# Patient Record
Sex: Female | Born: 1940 | Race: Black or African American | Hispanic: No
Health system: Southern US, Community
[De-identification: ages and names within clinical notes are randomized; demographics above are authoritative.]

---

## 2004-09-25 ENCOUNTER — Inpatient Hospital Stay: Payer: Self-pay | Admitting: Anesthesiology

## 2006-03-31 IMAGING — US US EXTREM LOW VENOUS BILAT
1 series · 17 of 24 positions shown · non-contrast
Comparison: none

REASON FOR EXAM: Edema,  ? DVT
COMMENTS:

[Series 1: us extrem low venous bilat · 17 of 31 slices shown]
[im 1/31]
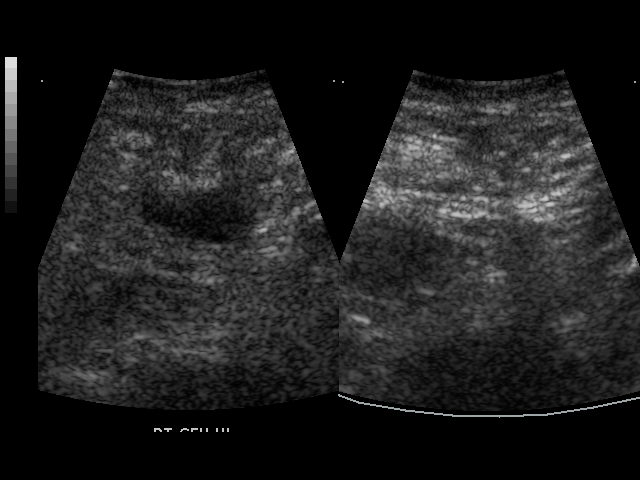
[im 3/31]
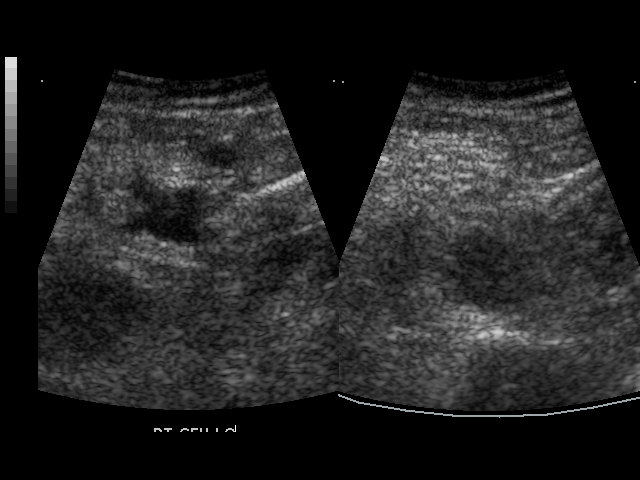
[im 4/31]
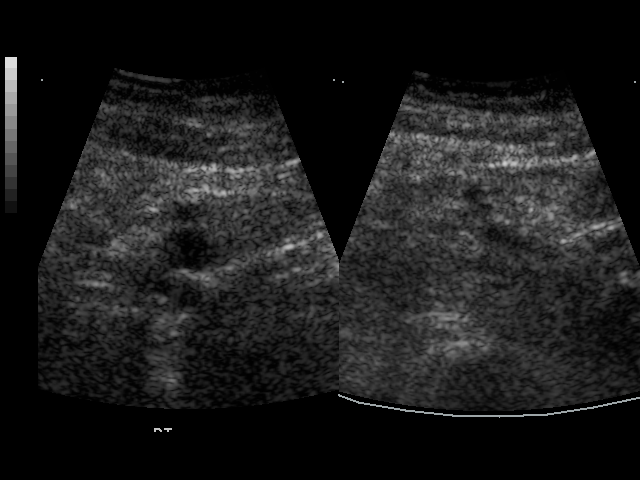
[im 6/31]
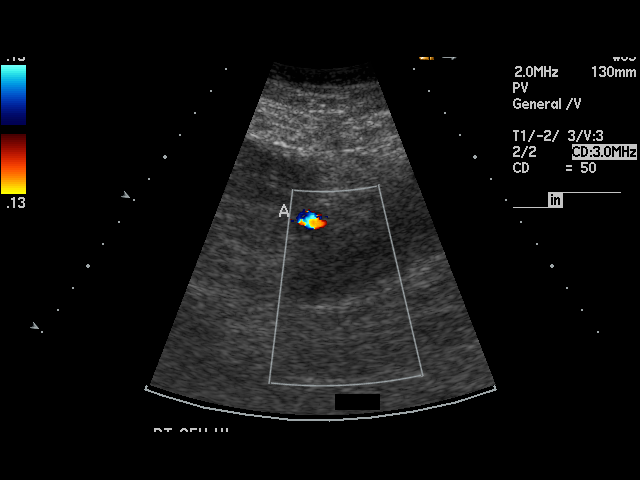
[im 8/31]
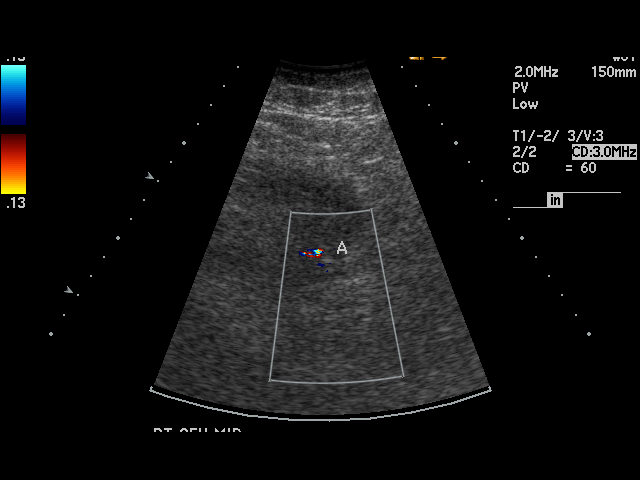
[im 10/31]
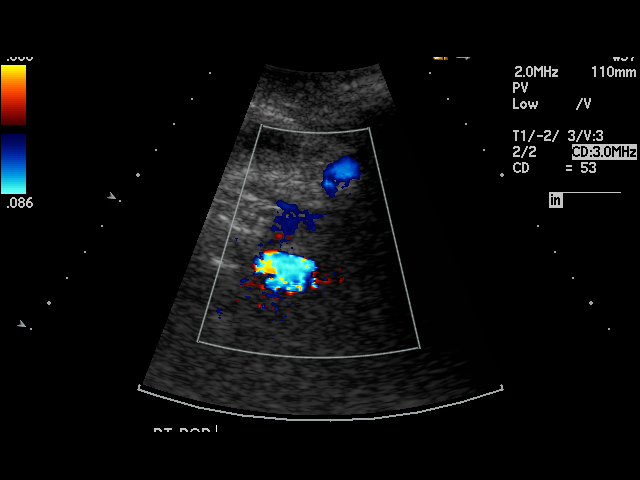
[im 12/31]
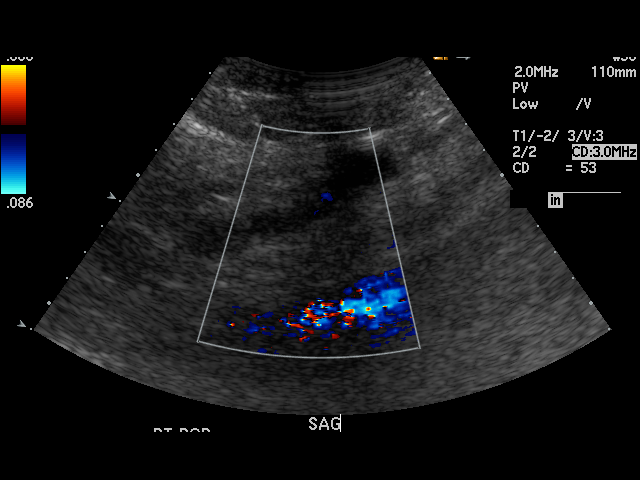
[im 14/31]
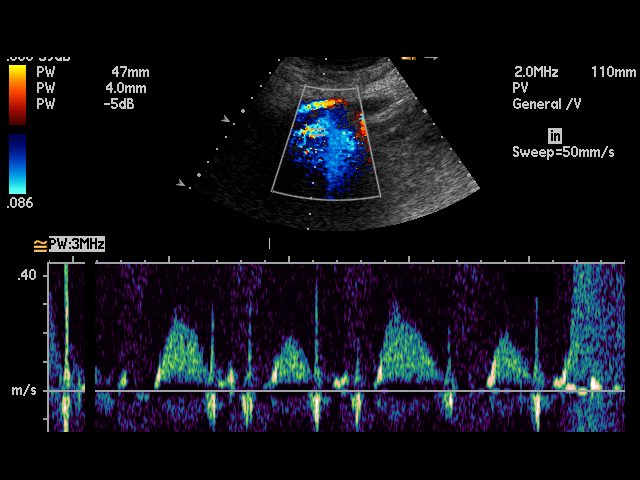
[im 16/31]
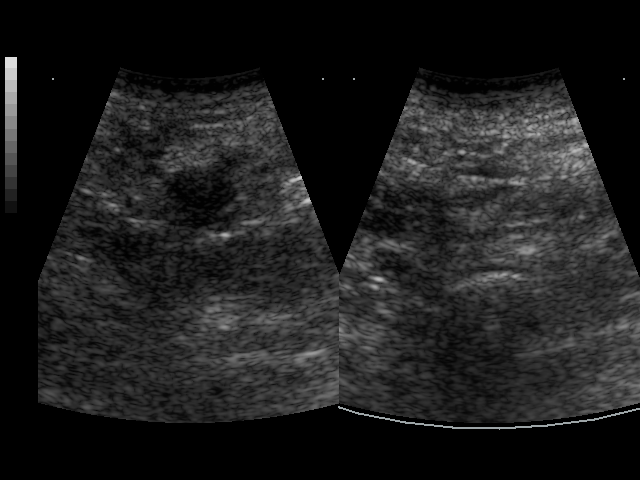
[im 17/31]
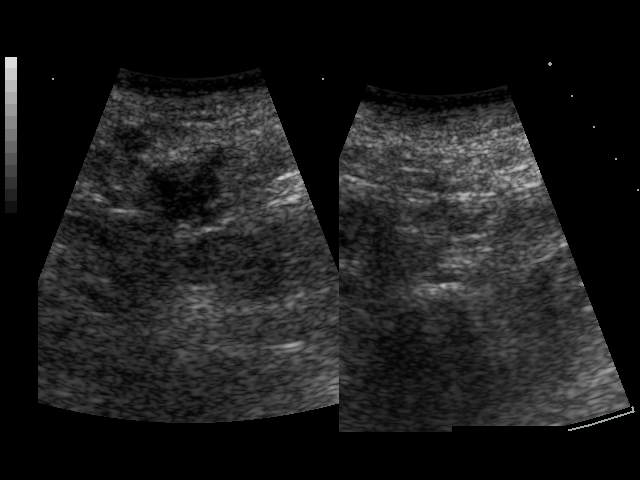
[im 19/31]
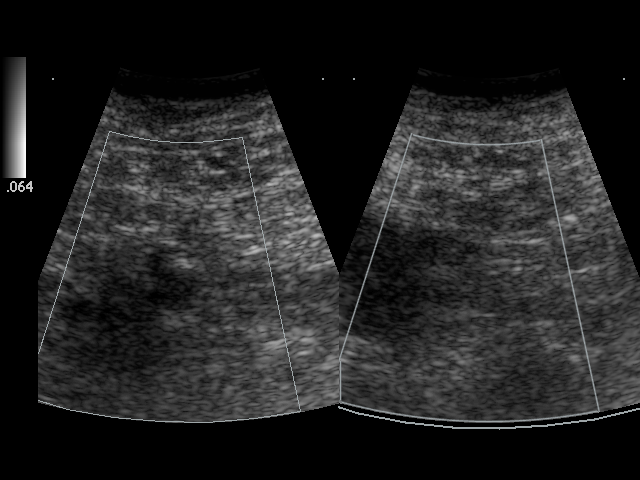
[im 21/31]
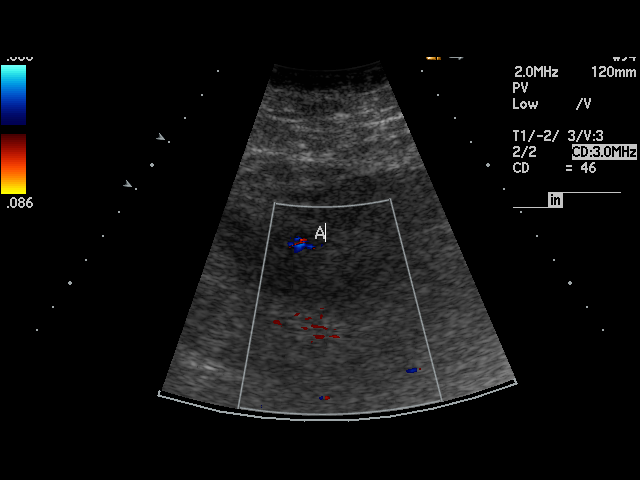
[im 23/31]
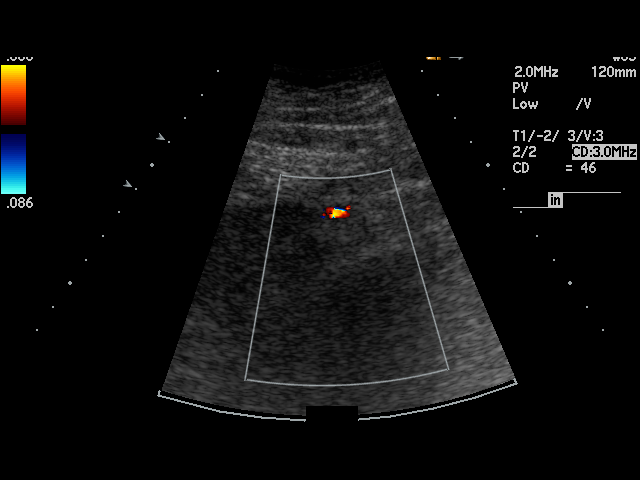
[im 25/31]
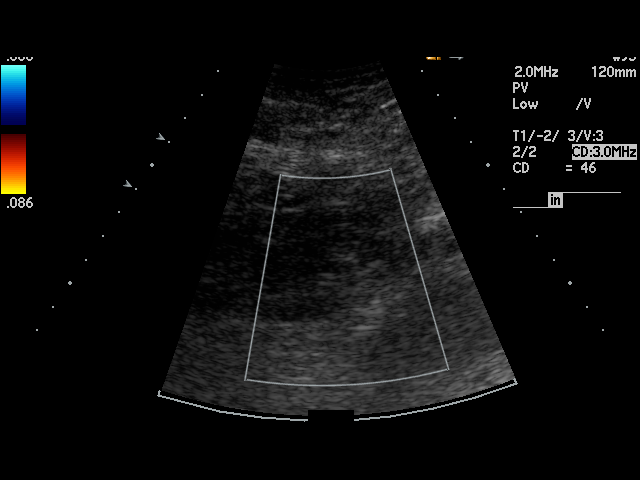
[im 27/31]
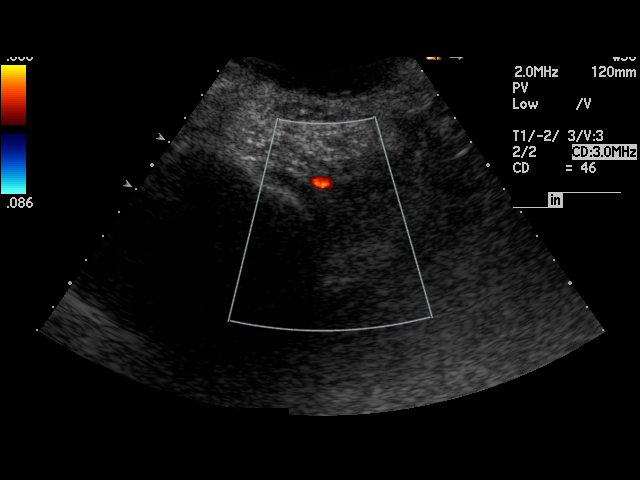
[im 28/31]
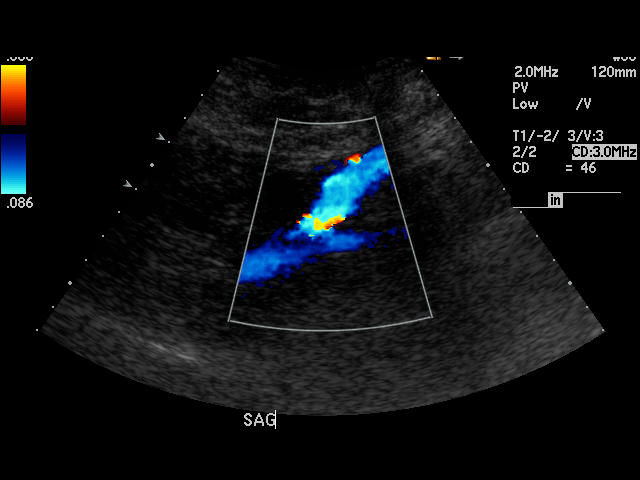
[im 31/31]
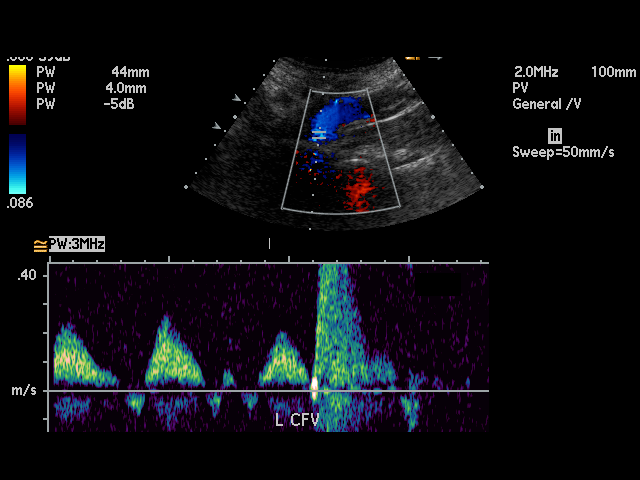

[17 of 24 positions shown; findings below may reference images not displayed]

PROCEDURE:     US  - US DOPPLER LOW EXTR BILATERAL  - September 27, 2004  [DATE]

RESULT:     This examination is limited particularly on the RIGHT since the
deep venous system could not be followed throughout its course by
ultrasound.  The proximal superficial femoral is seen bilaterally and is
patent.  Portions of the superficial femoral and popliteal are seen
bilaterally and do show flow and normal compressibility but all segments are
not visualized on this exam particularly on the RIGHT.  No findings in the
area visualized are seen to suggest deep venous thrombosis, but nonocclusive
thrombosis could be present and not detected.
IMPRESSION: This examination is limited, particularly on the RIGHT.

No deep venous thrombosis is identified but the venous structures are not
sufficiently visualized to definitely exclude nonocclusive thrombus.

## 2006-04-01 IMAGING — CR DG CHEST 2V
1 series · 2 of 2 positions shown · non-contrast
Comparison: none

REASON FOR EXAM: COPD
COMMENTS:

[Series 1: view not recorded · 0.17mm/px · 2 of 2 slices shown]
[im 1/2]
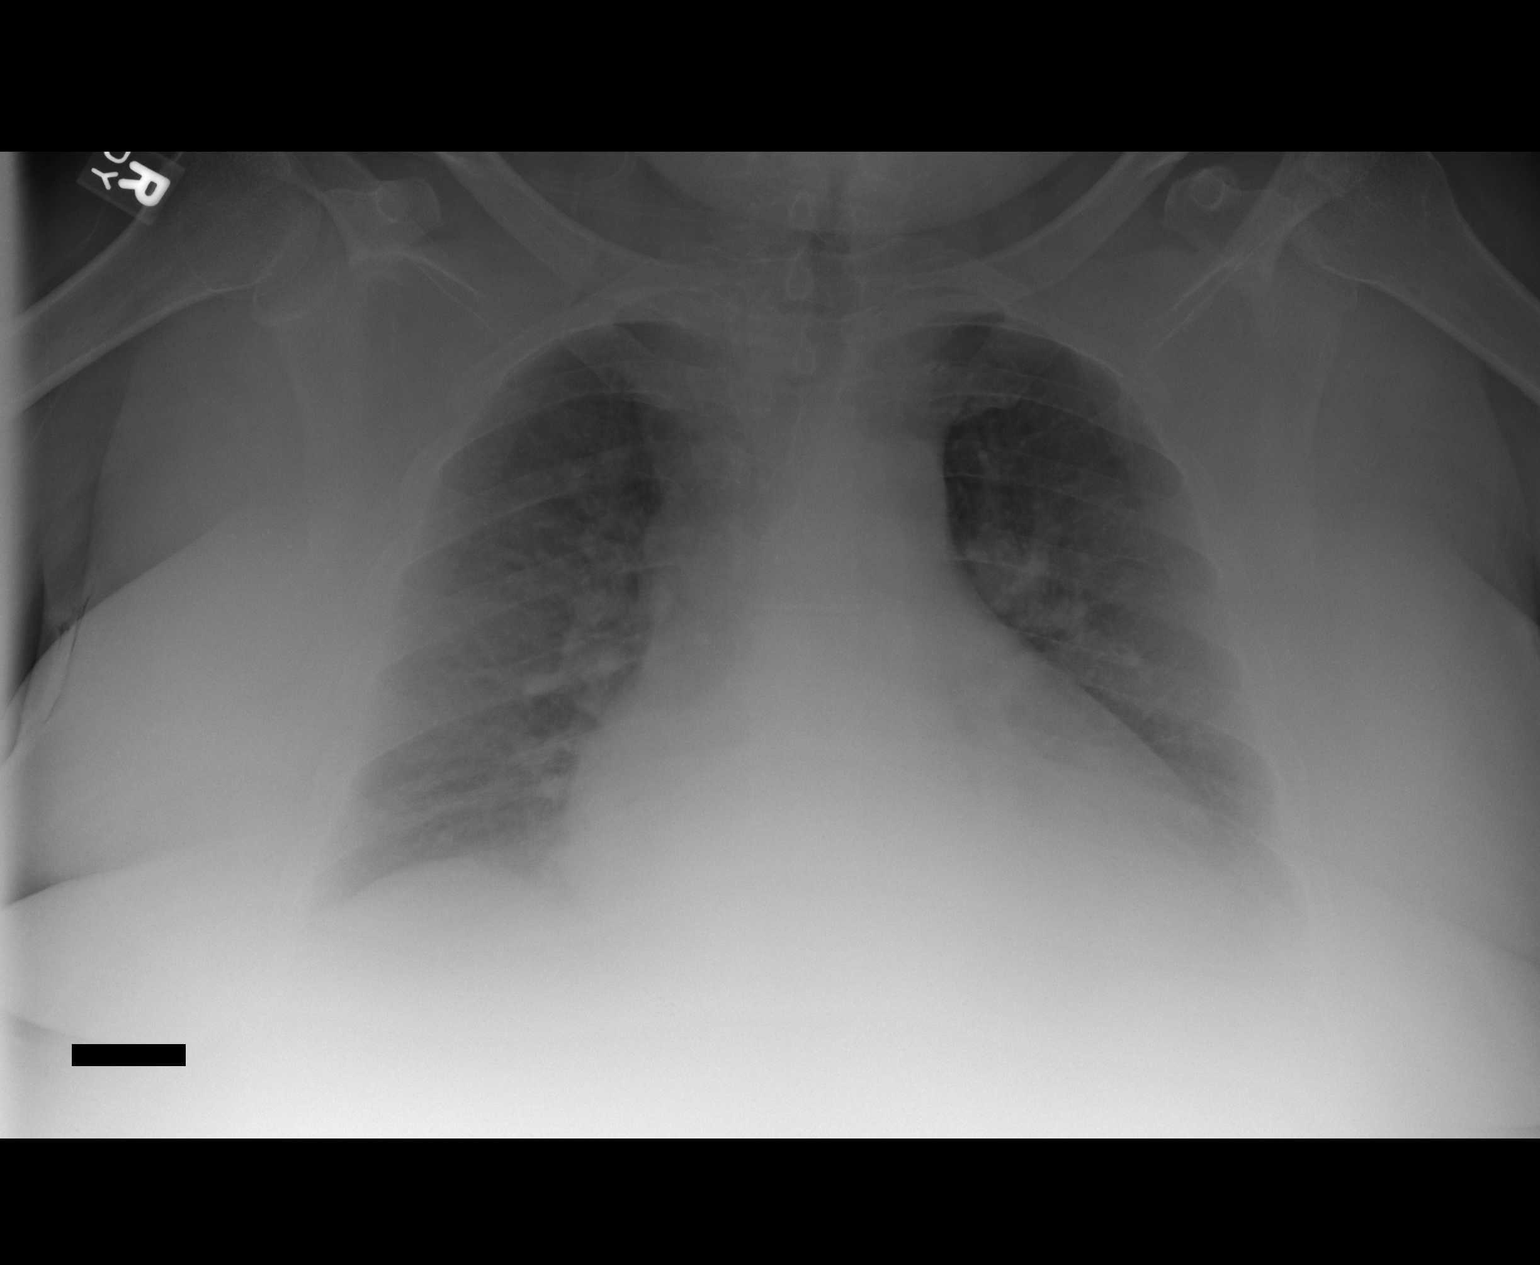
[im 2/2]
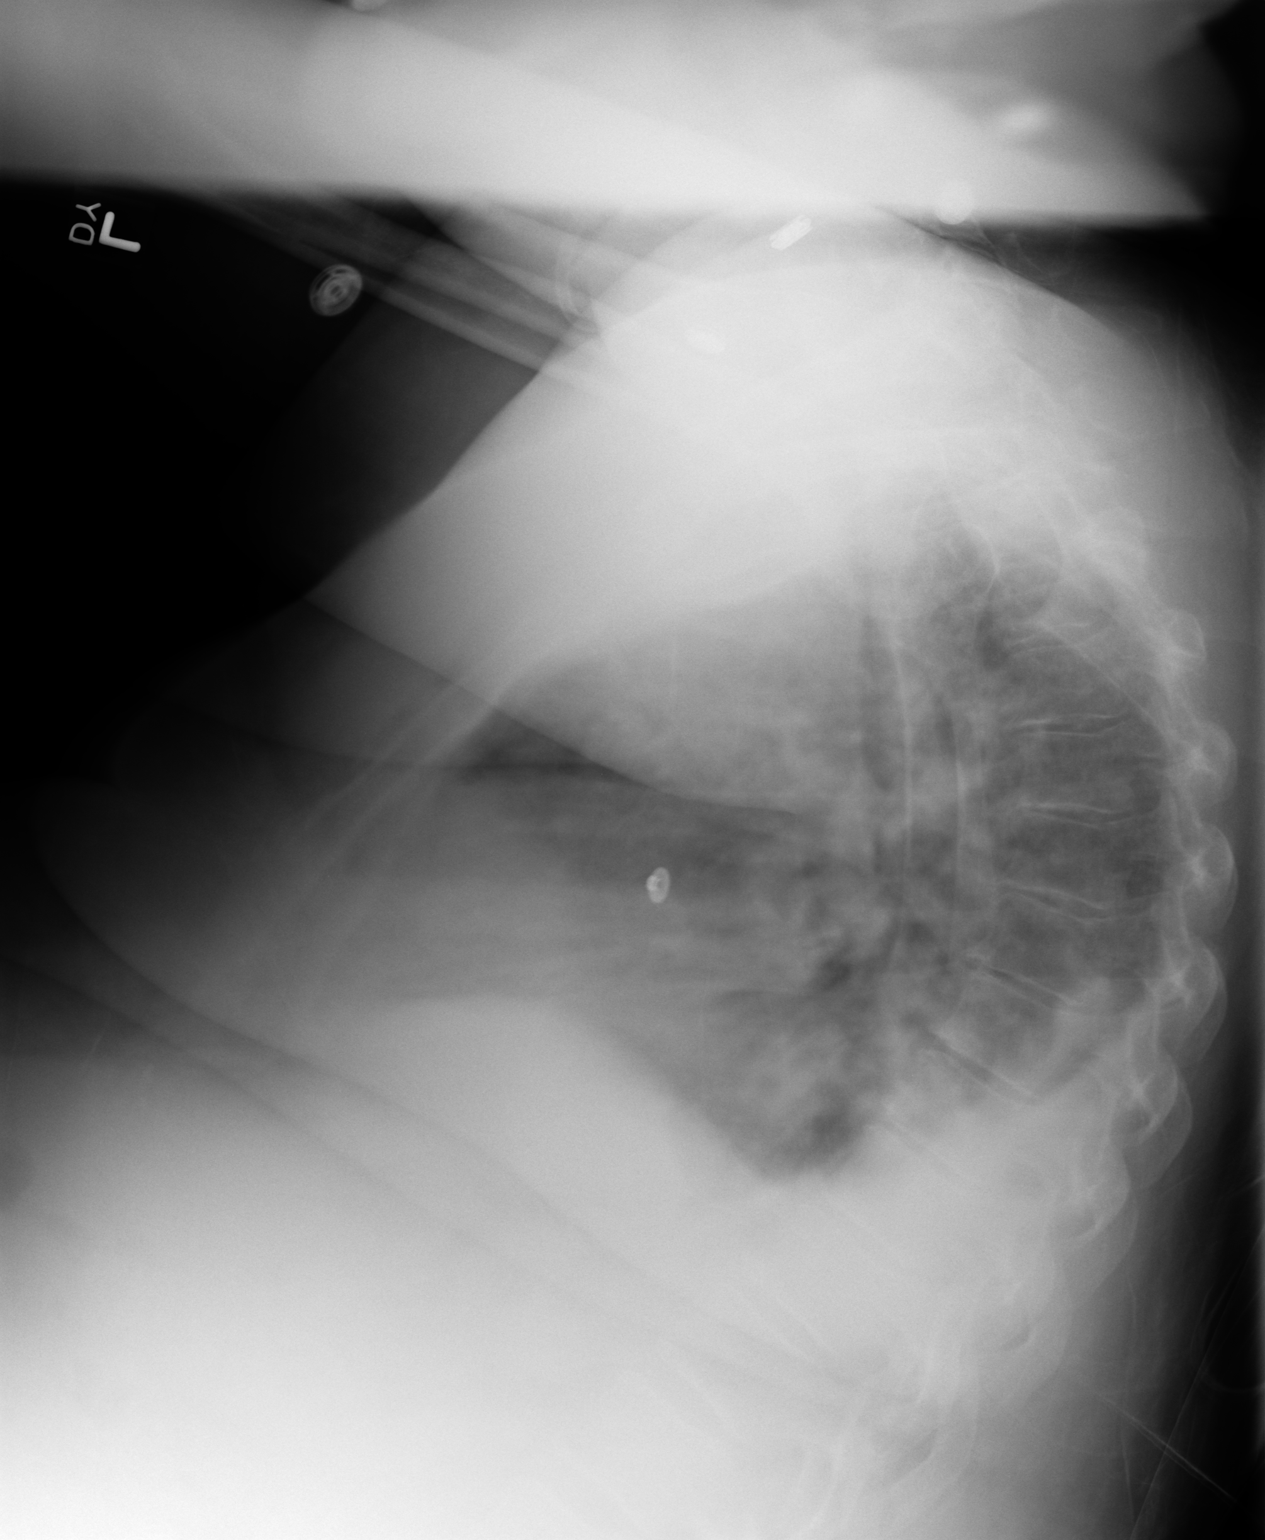

[2 of 2 positions shown; findings below may reference images not displayed]

PROCEDURE:     DXR - DXR CHEST PA (OR AP) AND LATERAL  - September 28, 2004  [DATE]

RESULT:     Comparison is made to a study of 09-27-04.

The lungs are mildly hypoinflated.  There remain fluid effusions layering
posteriorly.  The cardiopericardial silhouette remains enlarged.  The
pulmonary vascularity is somewhat less distinct today.
IMPRESSION: There are findings consistent with congestive heart failure and pulmonary
interstitial and alveolar edema as well as bilateral pleural effusion.
There does appear to have been some deterioration in the appearance of the
pulmonary interstitium since the prior study.

## 2006-04-03 IMAGING — US US RENAL KIDNEY
1 series · 17 of 18 positions shown · non-contrast
Comparison: none

REASON FOR EXAM: renal insufficiency
COMMENTS:

[Series 1: us renal kidney · 17 of 18 slices shown]
[im 1/18]
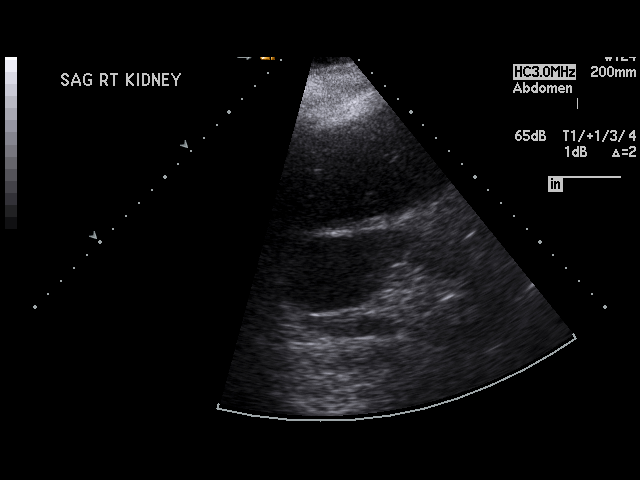
[im 2/18]
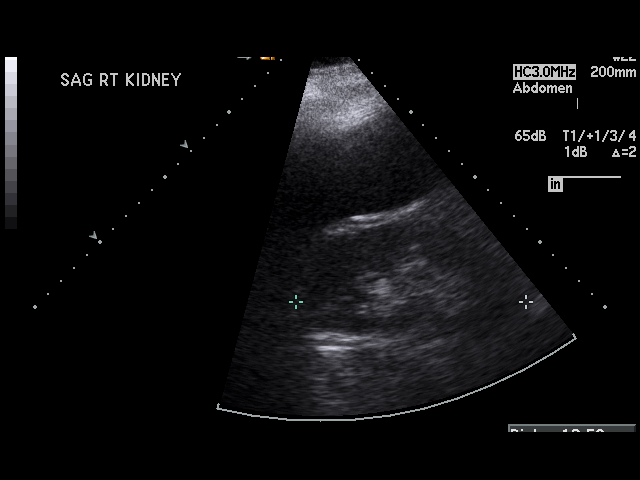
[im 3/18]
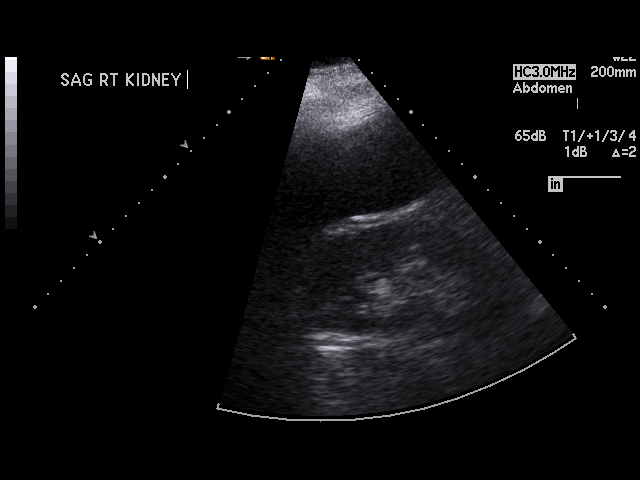
[im 4/18]
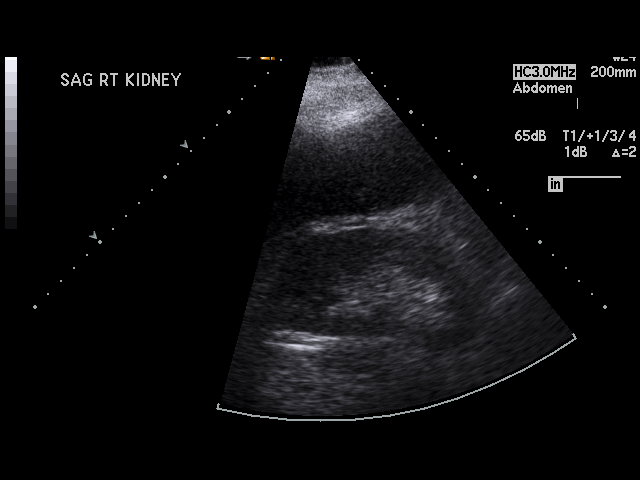
[im 5/18]
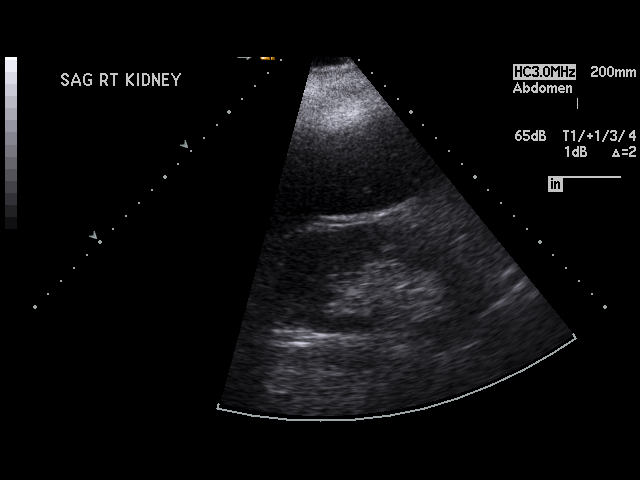
[im 6/18]
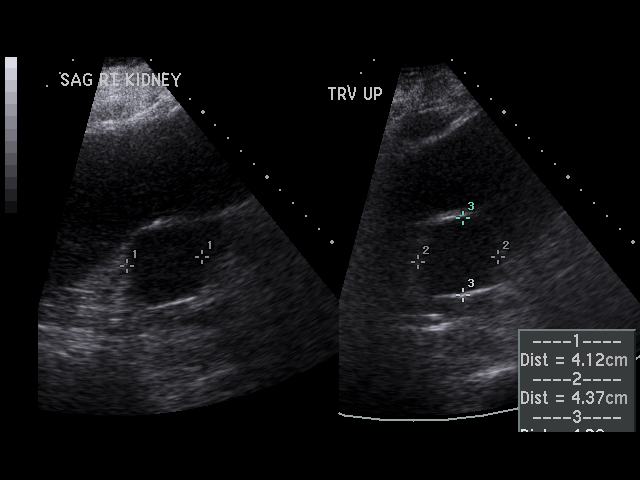
[im 7/18]
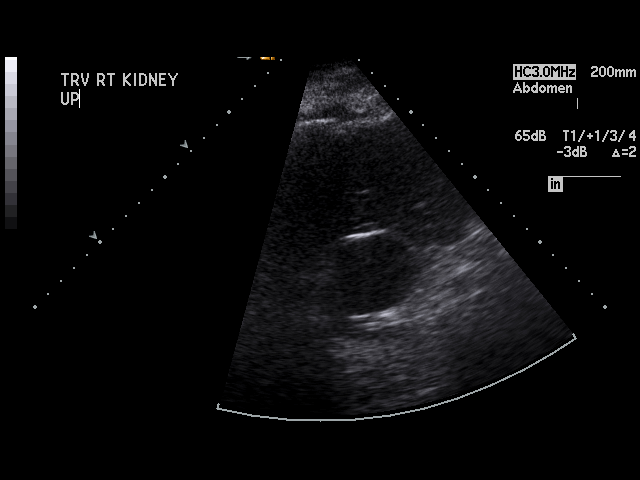
[im 8/18]
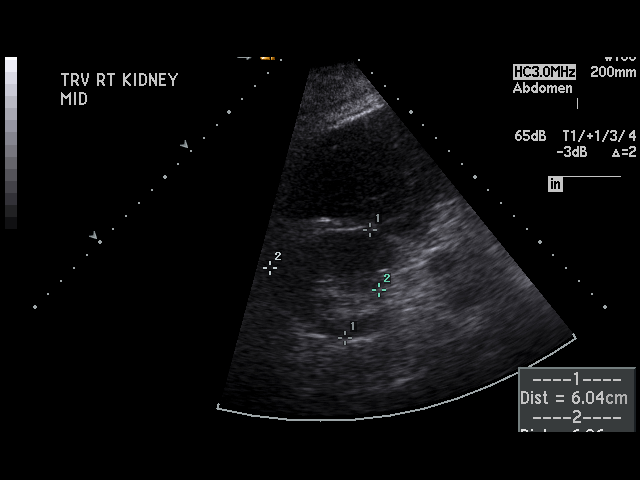
[im 10/18]
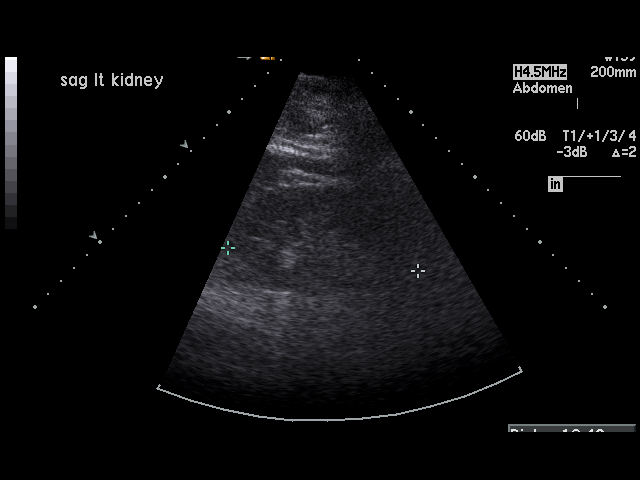
[im 11/18]
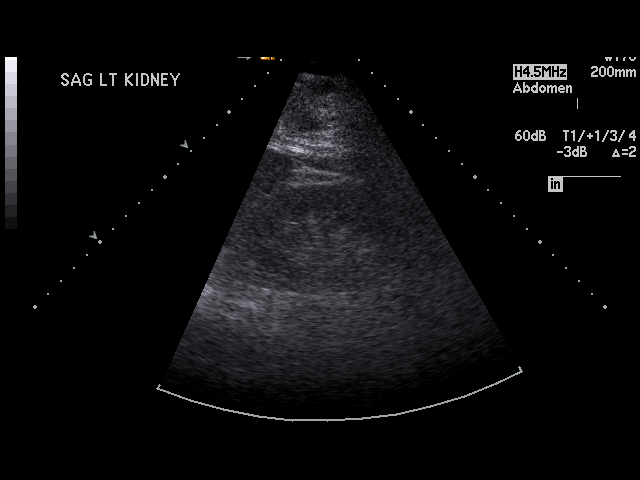
[im 12/18]
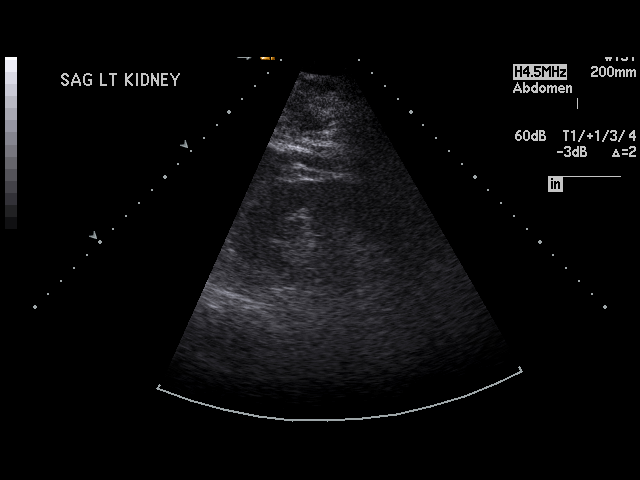
[im 13/18]
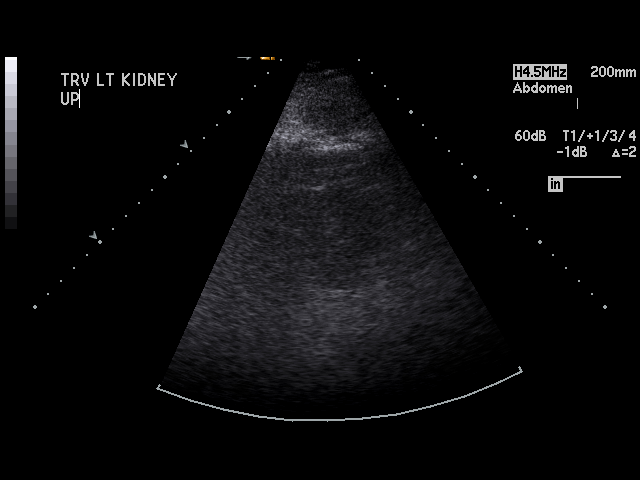
[im 14/18]
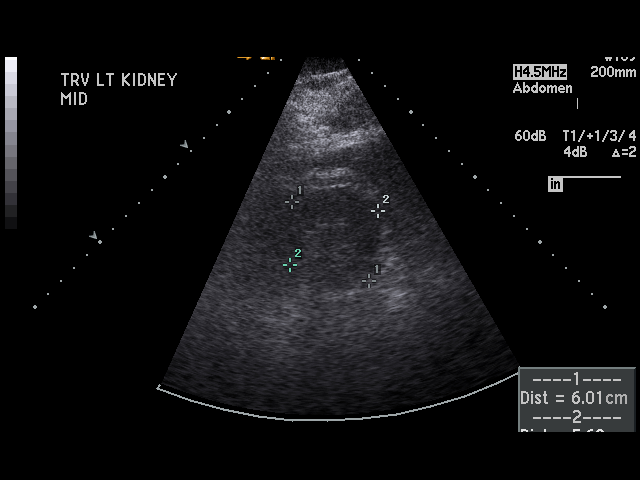
[im 15/18]
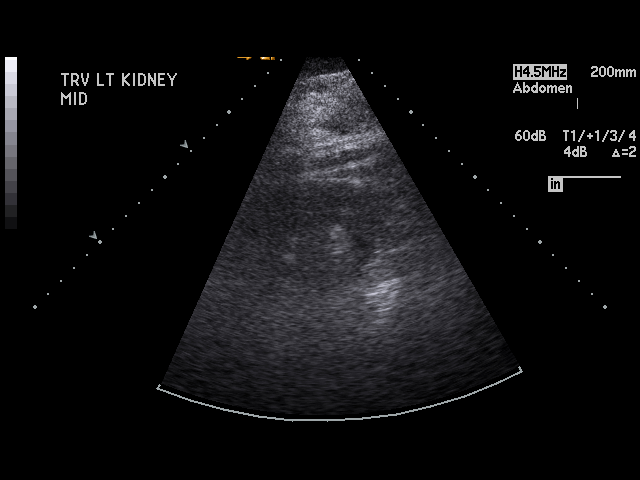
[im 16/18]
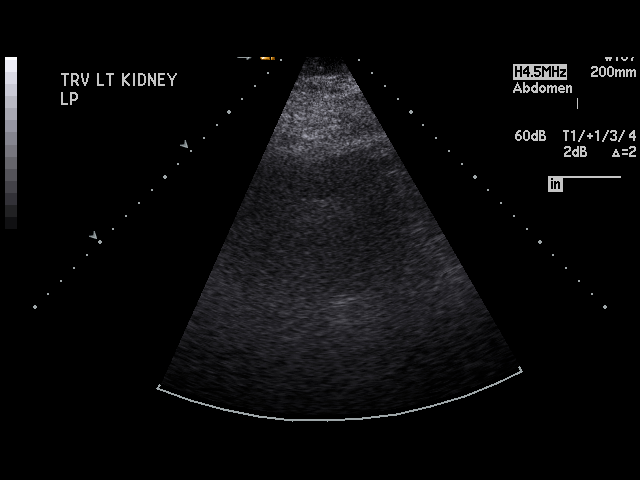
[im 17/18]
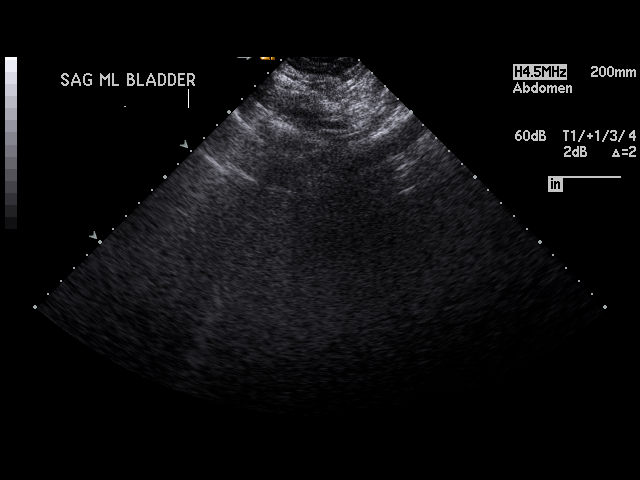
[im 18/18]
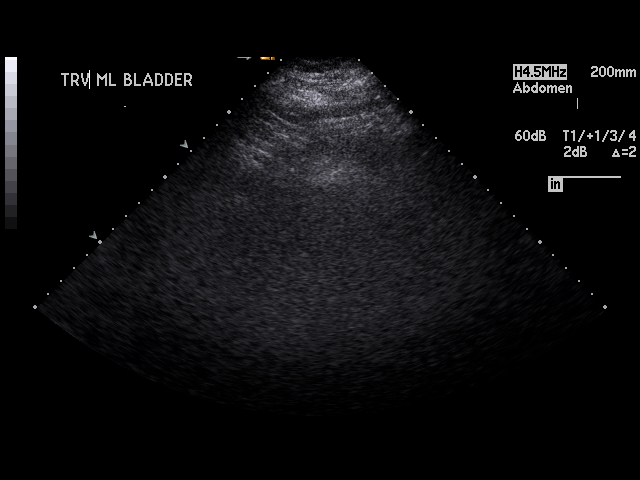

[17 of 18 positions shown; findings below may reference images not displayed]

PROCEDURE:     US  - US KIDNEY BILATERAL  - September 30, 2004  [DATE]

RESULT:        Duplex Doppler evaluation of the kidneys is performed.  The
RIGHT kidney measures 12.5 x 6.0 x 6.0 cm and contains a 4.3 x 4.2 x 4.1 cm
upper pole cyst.  The LEFT kidney measures 10.4 x 6.0 x 5.0 cm.  No fluid is
seen within the bladder.  The patient has a Foley in place.
IMPRESSION: Upper pole RIGHT renal cyst.  Otherwise, unremarkable study.

## 2007-06-10 ENCOUNTER — Ambulatory Visit: Payer: Self-pay | Admitting: Family Medicine

## 2020-12-17 ENCOUNTER — Other Ambulatory Visit: Payer: Self-pay | Admitting: Internal Medicine

## 2020-12-17 DIAGNOSIS — I482 Chronic atrial fibrillation, unspecified: Secondary | ICD-10-CM | POA: Insufficient documentation

## 2020-12-17 DIAGNOSIS — I63512 Cerebral infarction due to unspecified occlusion or stenosis of left middle cerebral artery: Secondary | ICD-10-CM | POA: Insufficient documentation

## 2020-12-17 DIAGNOSIS — J9621 Acute and chronic respiratory failure with hypoxia: Secondary | ICD-10-CM | POA: Insufficient documentation

## 2020-12-17 DIAGNOSIS — I5022 Chronic systolic (congestive) heart failure: Secondary | ICD-10-CM

## 2020-12-17 NOTE — Progress Notes (Signed)
[place]  Date of Service: 12/17/2020  PULMONARY CONSULT   Olivia Foster  MKL:[id number]  DOB: 06/09/1941     Referring Physician: Deanne Coffer, MD  HPI: Olivia Foster is a 80 y.o. female seen for Acute on Chronic Respiratory Failure.  Patient is transferred to our facility for further management.  Patient has a history of dementia peripheral vascular disease hypertension transferred to [facility] for an acute stroke.  Apparently was outside the window for tPA and thrombectomy CT showed a middle cerebral artery on the left side involvement.  The patient also was noted to have atrial fibrillation.  Echocardiogram showed an ejection fraction of 20 to 25%.  In addition to that she did have some pulmonary hypertension and a small pericardial effusion.  Patient ended up on the ventilator not able to come off and required prolonged mechanical ventilation.  Review of Systems:  ROS performed and is unremarkable other than noted above.  Past Medical History: Past Medical History:  Diagnosis Date   Alzheimer disease (CMS-HCC)   HTN (hypertension)   Peripheral vascular disease (CMS-HCC)   Past Surgical History: No past surgical history on file.  Social History: Social History   Socioeconomic History   Marital status: Single   Family History: No family history on file.   Family History: Non-Contributory to the present illness  Allergies  Reviewed on the [facility]  Medications: Reviewed on Rounds  Physical Exam:  Vitals: Temperature is 97.2 pulse 111 respiratory 26 blood pressure is 93/65 saturations 98%  Ventilator Settings on T collar FiO2 28%  General: Comfortable at this time Eyes: Grossly normal lids, irises & conjunctiva ENT: grossly tongue is normal Neck: no obvious mass Cardiovascular: S1-S2 normal no gallop rub Respiratory: No rhonchi no rales are noted at this time. Abdomen:  Skin: no rash seen on limited  exam Musculoskeletal: not rigid Psychiatric:unable to assess Neurologic: no seizure no involuntary movements         Labs on Admission:  Complete Blood Count (CBC) with Differential Specimen:  Blood  Ref Range & Units Today Comments  WBC (White Blood Cell Count) 3.2 - 9.8 x10^9/L 15.9 High     Hemoglobin 12.0 - 15.5 g/dL 7.7 Low     Hematocrit 35.0 - 45.0 % 26.0 Low     Platelets 150 - 450 x10^9/L 358    MCV (Mean Corpuscular Volume) 80 - 98 fL 103 High     MCH (Mean Corpuscular Hemoglobin) 26.5 - 34.0 pg 30.6    MCHC (Mean Corpuscular Hemoglobin Concentration) 31.4 - 36.0 % 29.6 Low     RBC (Red Blood Cell Count) 3.77 - 5.16 x10^12/L 2.52 Low     RDW-CV (Red Cell Distribution Width) 11.5 - 14.5 % 18.9 High     NRBC (Nucleated Red Blood Cell Count) 0 x10^9/L 0.00    NRBC % (Nucleated Red Blood Cell %) % 0.0    MPV (Mean Platelet Volume) 7.2 - 11.7 fL 10.8    Slide Review/Morphology  Yes  Macrocytes,Anisocytosis,  Resulting Agency  [facility]   Specimen Collected: 12/17/20 03:32 Last Resulted: 12/17/20 05:46  Comprehensive Metabolic Panel (CMP) Specimen:  Blood  Ref Range & Units Today Comments  Sodium 135 - 145 mmol/L 144    Potassium 3.5 - 5.0 mmol/L 4.7    Chloride 98 - 108 mmol/L 101    Carbon Dioxide (CO2) 21 - 30 mmol/L 36 High     Urea Nitrogen (BUN) 7 - 20  mg/dL 56 High     Creatinine 0.4 - 1.0 mg/dL 1.1 High     Glucose 70 - 140 mg/dL 161 High   Interpretive Data:  Above is the NONFASTING reference range.   Below are the FASTING reference ranges:  NORMAL:      70-99 mg/dL  PREDIABETES: 100-125 mg/dL  DIABETES:    > 125 mg/dL   Calcium 8.7 - 10.2 mg/dL 8.9    AST (Aspartate Aminotransferase) 15 - 41 U/L 53 High     ALT (Alanine Aminotransferase) 14 - 54 U/L 66 High     Bilirubin, Total 0.4 - 1.5 mg/dL 0.7    Alk Phos (Alkaline Phosphatase) 24 - 110 U/L 83    Albumin 3.5 - 4.8 g/dL 2.4 Low     Protein, Total 6.2 - 8.1 g/dL 6.9    Anion Gap 3 - 12  mmol/L 7    BUN/CREA Ratio 6 - 27 52 High     Glomerular Filtration Rate (eGFR)  mL/min/1.73sq m 51      Radiological Exams on Admission: EXAM: Portable chest 15:15 hours on 12/13/2020   INDICATION: Tracheostomy, line check.   COMPARISON:  none   TECHNIQUE: AP portable view of the chest was performed.   FINDINGS: Tracheostomy sleave projects in good position. The patient is  rotated to the right. There is moderate cardiomegaly and mild vascular  congestion. There is slight blunting left costophrenic angle with hazy  bibasilar opacity suggestive of small layering of pleural effusions as well  as bibasilar atelectasis. No frank lobar consolidation. Moderate  degenerative changes thoracic spine.   IMPRESSION: Good position of tracheostomy sleeve. Cardiomegaly and mild  vascular congestion with trace left pleural effusion. Bibasilar opacities  most consistent with atelectasis although a component of patchy pneumonitis  is in the differential.    Electronically Signed by:  Brent General, MD, [facility]  Electronically Signed on:  12/13/2020 4:38 PM  Assessment/Plan Patient Active Problem List   Diagnosis Date Noted   Acute on chronic respiratory failure with hypoxia ([place]) 12/17/2020   Acute ischemic left MCA stroke ([place]) 12/17/2020   Chronic HFrEF (heart failure with reduced ejection fraction) ([place]) 12/17/2020   Atrial fibrillation, chronic ([place]) 12/17/2020     Acute on chronic respiratory failure hypoxia [place] patient is on T collar actually is doing fairly well discussed on rounds to go ahead and change the trach out to a #6 cuffless trach Acute systolic heart failure chronic heart failure with reduced ejection fraction last EF was in the 20 to 25% range.  We will need to follow the patient's fluid status closely make sure that she is not fluid overloaded. Left MCA stroke supportive care therapy as tolerated Chronic atrial fibrillation rate and rate controlled we will  continue to monitor closely.  I have personally seen and evaluated the patient, evaluated laboratory and imaging results, formulated the assessment and plan and placed orders. The Patient requires high complexity decision making with multiple systems involvement.  Case was discussed on Rounds with the Respiratory Therapy Staff Time Spent 63mnutes  SAllyne Gee MD [facility]

## 2020-12-19 ENCOUNTER — Other Ambulatory Visit: Payer: Self-pay | Admitting: Internal Medicine

## 2020-12-23 NOTE — Progress Notes (Signed)
[facility]  PROGRESS NOTE  PULMONARY SERVICE ROUNDS   Olivia Foster  DOB: 19-Mar-1941  Referring physician: Larena Glassman, MD  HPI: Olivia Foster is a 80 y.o. female  being seen for Acute on Chronic Respiratory Failure.  Patient is currently on T collar has been on 28% FiO2 good saturations are noted  Review of Systems: Unremarkable other than noted in HPI  Allergies:  Reviewed on the [facility]  Medications: Reviewed  Vitals: Temperature is 99.6 pulse 111 respiratory 24 blood pressure 122/69 saturations 95%  Ventilator Settings: On T collar FiO2 is 28%  Physical Exam: General:  calm and comfortable NAD Eyes: normal lids, irises & conjunctiva ENT: grossly normal tongue not enlarged Neck: no masses Cardiovascular: S1 S2 Normal no rubs no gallop Respiratory: No rhonchi no rales are noted Abdomen: soft non-distended Skin: no rash seen on limited exam Musculoskeletal:  no rigidity Psychiatric: unable to assess Neurologic: no involuntary movements          Lab Data and radiological Data:  Labs been reviewed   Assessment/Plan  Patient Active Problem List   Diagnosis Date Noted   Acute on chronic respiratory failure with hypoxia (HCC) 12/17/2020   Acute ischemic left MCA stroke (HCC) 12/17/2020   Chronic HFrEF (heart failure with reduced ejection fraction) (HCC) 12/17/2020   Atrial fibrillation, chronic (HCC) 12/17/2020      Acute on chronic respiratory failure with hypoxia we will continue with the T-piece on 28% FiO2 titrate oxygen as tolerated continue pulmonary toilet. Acute stroke no change we will continue to monitor closely. Chronic heart failure reduced ejection fraction patient is at baseline Chronic atrial fibrillation) rate is controlled   I have personally evaluated the patient, evaluated the laboratory and imaging results and formulated the assessment and plan and placed orders as needed. The Patient requires high complexity  decision making with multiple system involvement. Rounds were done with the Respiratory Therapy Director and respiratory therapist involved in the care of the patient as well as nursing staff.   Yevonne Pax, MD [facility] Pulmonary Critical Care Medicine   This note is for inpatient care

## 2021-01-07 DEATH — deceased
